# Patient Record
Sex: Female | Born: 1970 | Race: Asian | Hispanic: No | Marital: Married | Smoking: Current some day smoker
Health system: Southern US, Community
[De-identification: ages and names within clinical notes are randomized; demographics above are authoritative.]

---

## 2012-01-28 ENCOUNTER — Other Ambulatory Visit: Payer: Self-pay | Admitting: Internal Medicine

## 2012-01-28 DIAGNOSIS — Z1231 Encounter for screening mammogram for malignant neoplasm of breast: Secondary | ICD-10-CM

## 2012-02-03 ENCOUNTER — Ambulatory Visit
Admission: RE | Admit: 2012-02-03 | Discharge: 2012-02-03 | Disposition: A | Discharge status: Home / Self Care | Payer: Medicaid Other | Source: Ambulatory Visit | Attending: Internal Medicine | Admitting: Internal Medicine

## 2012-02-03 DIAGNOSIS — Z1231 Encounter for screening mammogram for malignant neoplasm of breast: Secondary | ICD-10-CM

## 2013-03-19 ENCOUNTER — Other Ambulatory Visit: Payer: Self-pay | Admitting: Internal Medicine

## 2013-03-19 DIAGNOSIS — Z1231 Encounter for screening mammogram for malignant neoplasm of breast: Secondary | ICD-10-CM

## 2013-04-16 ENCOUNTER — Ambulatory Visit
Admission: RE | Admit: 2013-04-16 | Discharge: 2013-04-16 | Disposition: A | Discharge status: Home / Self Care | Payer: Medicaid Other | Source: Ambulatory Visit | Attending: Internal Medicine | Admitting: Internal Medicine

## 2013-04-16 DIAGNOSIS — Z1231 Encounter for screening mammogram for malignant neoplasm of breast: Secondary | ICD-10-CM

## 2014-04-03 ENCOUNTER — Other Ambulatory Visit: Payer: Self-pay

## 2014-04-03 DIAGNOSIS — Z1231 Encounter for screening mammogram for malignant neoplasm of breast: Secondary | ICD-10-CM

## 2014-04-18 ENCOUNTER — Ambulatory Visit
Admission: RE | Admit: 2014-04-18 | Discharge: 2014-04-18 | Disposition: A | Discharge status: Home / Self Care | Payer: Medicaid Other | Source: Ambulatory Visit

## 2014-04-18 ENCOUNTER — Encounter (INDEPENDENT_AMBULATORY_CARE_PROVIDER_SITE_OTHER): Payer: Self-pay

## 2014-04-18 DIAGNOSIS — Z1231 Encounter for screening mammogram for malignant neoplasm of breast: Secondary | ICD-10-CM

## 2015-03-25 ENCOUNTER — Other Ambulatory Visit: Payer: Self-pay

## 2015-03-25 DIAGNOSIS — Z1231 Encounter for screening mammogram for malignant neoplasm of breast: Secondary | ICD-10-CM

## 2015-04-21 ENCOUNTER — Ambulatory Visit
Admission: RE | Admit: 2015-04-21 | Discharge: 2015-04-21 | Disposition: A | Discharge status: Home / Self Care | Payer: Medicaid Other | Source: Ambulatory Visit

## 2015-04-21 DIAGNOSIS — Z1231 Encounter for screening mammogram for malignant neoplasm of breast: Secondary | ICD-10-CM

## 2016-05-03 ENCOUNTER — Other Ambulatory Visit: Payer: Self-pay | Admitting: Internal Medicine

## 2016-05-03 DIAGNOSIS — Z1231 Encounter for screening mammogram for malignant neoplasm of breast: Secondary | ICD-10-CM

## 2016-05-06 ENCOUNTER — Ambulatory Visit
Admission: RE | Admit: 2016-05-06 | Discharge: 2016-05-06 | Disposition: A | Discharge status: Home / Self Care | Payer: Medicaid Other | Source: Ambulatory Visit | Attending: Internal Medicine | Admitting: Internal Medicine

## 2016-05-06 DIAGNOSIS — Z1231 Encounter for screening mammogram for malignant neoplasm of breast: Secondary | ICD-10-CM

## 2017-05-06 ENCOUNTER — Other Ambulatory Visit: Payer: Self-pay | Admitting: Internal Medicine

## 2017-05-06 DIAGNOSIS — Z1231 Encounter for screening mammogram for malignant neoplasm of breast: Secondary | ICD-10-CM

## 2017-05-20 ENCOUNTER — Ambulatory Visit
Admission: RE | Admit: 2017-05-20 | Discharge: 2017-05-20 | Disposition: A | Discharge status: Home / Self Care | Payer: Medicaid Other | Source: Ambulatory Visit | Attending: Internal Medicine | Admitting: Internal Medicine

## 2017-05-20 DIAGNOSIS — Z1231 Encounter for screening mammogram for malignant neoplasm of breast: Secondary | ICD-10-CM

## 2018-05-02 ENCOUNTER — Other Ambulatory Visit: Payer: Self-pay | Admitting: Internal Medicine

## 2018-05-02 DIAGNOSIS — Z1231 Encounter for screening mammogram for malignant neoplasm of breast: Secondary | ICD-10-CM

## 2018-05-26 ENCOUNTER — Encounter: Payer: Self-pay | Admitting: Radiology

## 2018-05-26 ENCOUNTER — Ambulatory Visit
Admission: RE | Admit: 2018-05-26 | Discharge: 2018-05-26 | Disposition: A | Discharge status: Home / Self Care | Payer: Medicaid Other | Source: Ambulatory Visit | Attending: Internal Medicine | Admitting: Internal Medicine

## 2018-05-26 DIAGNOSIS — Z1231 Encounter for screening mammogram for malignant neoplasm of breast: Secondary | ICD-10-CM

## 2019-12-19 ENCOUNTER — Encounter: Payer: Self-pay | Admitting: Internal Medicine

## 2019-12-19 ENCOUNTER — Ambulatory Visit (INDEPENDENT_AMBULATORY_CARE_PROVIDER_SITE_OTHER): Payer: 59 | Admitting: Internal Medicine

## 2019-12-19 VITALS — BP 169/70 | HR 75 | Temp 98.9°F | Ht 59.0 in | Wt 137.2 lb

## 2019-12-19 DIAGNOSIS — Z1239 Encounter for other screening for malignant neoplasm of breast: Secondary | ICD-10-CM

## 2019-12-19 DIAGNOSIS — Z131 Encounter for screening for diabetes mellitus: Secondary | ICD-10-CM | POA: Diagnosis not present

## 2019-12-19 DIAGNOSIS — R030 Elevated blood-pressure reading, without diagnosis of hypertension: Secondary | ICD-10-CM | POA: Insufficient documentation

## 2019-12-19 DIAGNOSIS — L609 Nail disorder, unspecified: Secondary | ICD-10-CM

## 2019-12-19 DIAGNOSIS — Z13220 Encounter for screening for lipoid disorders: Secondary | ICD-10-CM

## 2019-12-19 DIAGNOSIS — Z720 Tobacco use: Secondary | ICD-10-CM

## 2019-12-19 DIAGNOSIS — Z114 Encounter for screening for human immunodeficiency virus [HIV]: Secondary | ICD-10-CM | POA: Diagnosis not present

## 2019-12-19 DIAGNOSIS — Z1231 Encounter for screening mammogram for malignant neoplasm of breast: Secondary | ICD-10-CM

## 2019-12-19 DIAGNOSIS — Z0000 Encounter for general adult medical examination without abnormal findings: Secondary | ICD-10-CM

## 2019-12-19 DIAGNOSIS — F17200 Nicotine dependence, unspecified, uncomplicated: Secondary | ICD-10-CM

## 2019-12-19 LAB — POCT GLYCOSYLATED HEMOGLOBIN (HGB A1C): Hemoglobin A1C: 6 % — AB (ref 4.0–5.6)

## 2019-12-19 LAB — GLUCOSE, CAPILLARY: Glucose-Capillary: 104 mg/dL — ABNORMAL HIGH (ref 70–99)

## 2019-12-19 NOTE — Assessment & Plan Note (Signed)
Patient reports smoking approximately 1 cigarette/week

## 2019-12-19 NOTE — Patient Instructions (Signed)
You were seen to establish care. I do not think the change in your toenail is concerning. We checked blood work today to check your cholesterol, blood sugar, and HIV. These are all routine tests. I will call you with the results.  Thank you for allowing [place] to be part of your medical care!

## 2019-12-19 NOTE — Progress Notes (Signed)
   CC: Change in toenail  HPI: Patient is a 49 year old female with no significant past medical history who presents to establish care.  Medical history: Denies all medical history including diabetes and hypertension  Surgical History: Denies past surgeries  Review of Systems:   Review of Systems  Constitutional: Negative for chills and fever.  Respiratory: Negative for shortness of breath.   Cardiovascular: Negative for chest pain.  All other systems reviewed and are negative.  Family History: Denies family history of medical disease, specifically denies family history of diabetes and hypertension  Social: Social history significant for smoking 1 cigarette/week  Allergies: Denies allergies to medications  Physical Exam:  Vitals:   12/19/19 1423 12/19/19 1503  BP: (!) 151/107 (!) 169/70  Pulse: 82 75  Temp: 98.9 F (37.2 C)   TempSrc: Oral   SpO2: 99%   Weight: 137 lb 3.2 oz (62.2 kg)   Height: 4\' 11"  (1.499 m)    Physical Exam  Constitutional: She is well-developed, well-nourished, and in no distress.  HENT:  Head: Normocephalic and atraumatic.  Eyes: EOM are normal. Right eye exhibits no discharge. Left eye exhibits no discharge.  Neck: No tracheal deviation present.  Cardiovascular: Normal rate and regular rhythm. Exam reveals no gallop and no friction rub.  No murmur heard. Loud S2  Pulmonary/Chest: Effort normal and breath sounds normal. No respiratory distress. She has no wheezes. She has no rales.  Abdominal: Soft. She exhibits no distension. There is no abdominal tenderness. There is no rebound and no guarding.  Musculoskeletal:        General: No tenderness, deformity or edema. Normal range of motion.     Cervical back: Normal range of motion.     Comments: Right large toenail as pictured below  Neurological: She is alert. Coordination normal.  Skin: Skin is warm and dry. No rash noted. She is not diaphoretic. No erythema.  Psychiatric: Memory and judgment  normal.       Assessment & Plan:   See Encounters Tab for problem based charting.  Patient discussed with Dr. 

## 2019-12-19 NOTE — Assessment & Plan Note (Addendum)
Patient is a 49 year old female with a BMI of approximately 28. We will screen patient for HIV, acquire lipid panel, and hemoglobin A1c.  Last mammogram was 2 years ago, referral for repeat mammogram sent.

## 2019-12-19 NOTE — Assessment & Plan Note (Addendum)
Patient with blood pressure of 151/107, 169/70 on recheck. Elevation is asymptomatic. Patient counseled on weight loss, dietary changes.  Given that these are isolated readings, will have patient follow-up for repeat blood pressure check in 4 weeks and consideration of antihypertensive therapy.

## 2019-12-20 LAB — LIPID PANEL
Chol/HDL Ratio: 6.1 ratio — ABNORMAL HIGH (ref 0.0–4.4)
Cholesterol, Total: 188 mg/dL (ref 100–199)
HDL: 31 mg/dL — ABNORMAL LOW (ref >39)
LDL Chol Calc (NIH): 82 mg/dL (ref 0–99)
Triglycerides: 461 mg/dL — ABNORMAL HIGH (ref 0–149)
VLDL Cholesterol Cal: 75 mg/dL — ABNORMAL HIGH (ref 5–40)

## 2019-12-20 LAB — HIV ANTIBODY (ROUTINE TESTING W REFLEX): HIV Screen 4th Generation wRfx: NONREACTIVE

## 2019-12-26 NOTE — Progress Notes (Signed)
Internal Medicine Clinic Attending  Case discussed with Dr. MacLean at the time of the visit.  We reviewed the resident's history and exam and pertinent patient test results.  I agree with the assessment, diagnosis, and plan of care documented in the resident's note.    

## 2020-01-15 ENCOUNTER — Other Ambulatory Visit: Payer: Self-pay

## 2020-01-15 ENCOUNTER — Ambulatory Visit
Admission: RE | Admit: 2020-01-15 | Discharge: 2020-01-15 | Disposition: A | Discharge status: Home / Self Care | Payer: 59 | Source: Ambulatory Visit | Attending: Internal Medicine | Admitting: Internal Medicine

## 2020-01-15 DIAGNOSIS — Z1239 Encounter for other screening for malignant neoplasm of breast: Secondary | ICD-10-CM

## 2020-06-27 ENCOUNTER — Other Ambulatory Visit: Payer: Self-pay

## 2020-06-27 ENCOUNTER — Other Ambulatory Visit (HOSPITAL_COMMUNITY)
Admission: RE | Admit: 2020-06-27 | Discharge: 2020-06-27 | Disposition: A | Discharge status: Home / Self Care | Payer: 59 | Source: Ambulatory Visit | Attending: Obstetrics & Gynecology | Admitting: Obstetrics & Gynecology

## 2020-06-27 ENCOUNTER — Ambulatory Visit (INDEPENDENT_AMBULATORY_CARE_PROVIDER_SITE_OTHER): Payer: 59 | Admitting: Obstetrics & Gynecology

## 2020-06-27 ENCOUNTER — Encounter: Payer: Self-pay | Admitting: Obstetrics & Gynecology

## 2020-06-27 VITALS — BP 182/85 | HR 66 | Wt 135.1 lb

## 2020-06-27 DIAGNOSIS — Z23 Encounter for immunization: Secondary | ICD-10-CM | POA: Diagnosis not present

## 2020-06-27 DIAGNOSIS — Z01419 Encounter for gynecological examination (general) (routine) without abnormal findings: Secondary | ICD-10-CM | POA: Insufficient documentation

## 2020-06-27 DIAGNOSIS — Z1211 Encounter for screening for malignant neoplasm of colon: Secondary | ICD-10-CM

## 2020-06-27 NOTE — Progress Notes (Signed)
Subjective:     Valerie Galloway is a 49 y.o. female here for a routine exam. G5P5. Pt does not know the ages of her children however her youngest in 4 years old. She had SVD x5 3 in [place] and 2 in [place].  She did breast feed each child from 3-5 years. She reports that her LMP was 2017.  She denies complaints. No leaking of urine.    Gynecologic History Patient's last menstrual period was 05/11/2017. Contraception: post menopausal status Last Pap: 2012 Results were: normal Last mammogram: 01/15/2020. Results were: normal  Obstetric History OB History  Gravida Para Term Preterm AB Living  5 5 5     5   SAB TAB Ectopic Multiple Live Births          3    # Outcome Date GA Lbr Len/2nd Weight Sex Delivery Anes PTL Lv  5 Term           4 Term           3 Term     F    LIV  2 Term     F    LIV  1 Term     F    LIV     The following portions of the patient's history were reviewed and updated as appropriate: allergies, current medications, past family history, past medical history, past social history, past surgical history and problem list.  Review of Systems Pertinent items are noted in HPI.    Objective:   BP (!) 182/85   Pulse 66   Wt 135 lb 1.3 oz (61.3 kg)   LMP 05/11/2017   BMI 27.28 kg/m  General Appearance:    Alert, cooperative, no distress, appears stated age  Head:    Normocephalic, without obvious abnormality, atraumatic  Eyes:    conjunctiva/corneas clear, EOM's intact, both eyes  Ears:    Normal external ear canals, both ears  Nose:   Nares normal, septum midline, mucosa normal, no drainage    or sinus tenderness  Throat:   Lips, mucosa, and tongue normal; teeth and gums normal  Neck:   Supple, symmetrical, trachea midline, no adenopathy;    thyroid:  no enlargement/tenderness/nodules  Back:     Symmetric, no curvature, ROM normal, no CVA tenderness  Lungs:     respirations unlabored  Chest Wall:    No tenderness or deformity   Heart:    Regular rate and rhythm   Breast Exam:    No tenderness, masses, or nipple abnormality  Abdomen:     Soft, non-tender, bowel sounds active all four quadrants,    no masses, no organomegaly  Genitalia:    Normal female without lesion, discharge or tenderness     Extremities:   Extremities normal, atraumatic, no cyanosis or edema  Pulses:   2+ and symmetric all extremities  Skin:   Skin color, texture, turgor normal, no rashes or lesions    Assessment:    Healthy female exam.   Colon cancer screen- pt is not familiar with this and has not had any screen.    Plan:   Valerie Galloway was seen today for gynecologic exam.  Diagnoses and all orders for this visit:  Encounter for well woman exam with routine gynecological exam -     Cytology - PAP  Screening for colon cancer -     Ambulatory referral to Gastroenterology  Other orders -     Flu Vaccine QUAD 36+ mos IM (Fluarix,  Quad PF)  f/u in 1 year or sooner prn   Valerie Galloway, M.D., [facility]

## 2020-06-27 NOTE — Progress Notes (Signed)
fluNo Pap on file Flu Vaccine Mammo: 01/16/2020 Colonoscopy: Never

## 2020-06-30 LAB — CYTOLOGY - PAP
Comment: NEGATIVE
Diagnosis: NEGATIVE
High risk HPV: NEGATIVE

## 2020-09-22 ENCOUNTER — Telehealth: Payer: Self-pay | Admitting: *Deleted

## 2020-09-22 NOTE — Telephone Encounter (Signed)
Patient did not call us back. Cancelled colon and PV, mailed no show letter.

## 2020-09-22 NOTE — Telephone Encounter (Signed)
Patient no show PV appointment for today. I called patient, no answer, left a message for the patient to call us back today before 5 pm or the PV and procedure will be cancelled  

## 2020-09-22 NOTE — Telephone Encounter (Signed)
Son in law answered the phone and he will speak with her and call us back today before 5 pm to get the nurse visit rescheduled. He is aware if he does not call back before 5 pm then colon and PV is cancelled.

## 2020-10-06 ENCOUNTER — Encounter: Payer: 59 | Admitting: Internal Medicine

## 2021-03-04 ENCOUNTER — Other Ambulatory Visit: Payer: Self-pay | Admitting: Internal Medicine

## 2021-03-04 DIAGNOSIS — Z1231 Encounter for screening mammogram for malignant neoplasm of breast: Secondary | ICD-10-CM

## 2021-05-01 ENCOUNTER — Other Ambulatory Visit: Payer: Self-pay

## 2021-05-01 ENCOUNTER — Ambulatory Visit
Admission: RE | Admit: 2021-05-01 | Discharge: 2021-05-01 | Disposition: A | Discharge status: Home / Self Care | Payer: Medicaid Other | Source: Ambulatory Visit | Attending: Internal Medicine | Admitting: Internal Medicine

## 2021-05-01 DIAGNOSIS — Z1231 Encounter for screening mammogram for malignant neoplasm of breast: Secondary | ICD-10-CM

## 2021-07-11 ENCOUNTER — Ambulatory Visit: Payer: Medicaid Other | Admitting: Family Medicine

## 2021-08-15 ENCOUNTER — Ambulatory Visit (INDEPENDENT_AMBULATORY_CARE_PROVIDER_SITE_OTHER): Payer: Self-pay | Admitting: Family Medicine

## 2021-08-15 DIAGNOSIS — Z0289 Encounter for other administrative examinations: Secondary | ICD-10-CM

## 2021-08-19 NOTE — Progress Notes (Signed)
  Patient Name: Valerie Galloway Date of Birth: 1971-07-29 Date of Visit: 08/19/21 PCP: Patient, No Pcp Per (Inactive)  Chief Complaint: form completion   Subjective: Valerie Galloway is a pleasant 50 y.o. presenting today for completion of N-648 form.   The patient speaks Nepali as their primary language.  An interpreter was used for the entire visit. Patient was seen with resident Dorothyann Gibbs.  The purpose of this visit was explained with to the patient and family members. The patient was interviewed alone.   Identification confirmed and documented on N-648.   I have reviewed the patient's medical, surgical, family, and social history as appropriate.   HEENT: Sclera anicteric. Appears well hydrated. Neck: Supple Cardiac: Regular rate and rhythm. Normal S1/S2. No murmurs, rubs, or gallops appreciated. Lungs: Clear bilaterally to ascultation.  Abdomen: non-distended Extremities: Warm, well perfused without edema.  Skin: Warm, dry Psych: Pleasant and appropriate   Diagnoses and all orders for this visit:  Encounter for health examination of refugee    Patient signed [phone] Interpreter signed [phone]    Burley Saver, MD  [facility] Medicine Teaching Service

## 2022-03-29 ENCOUNTER — Other Ambulatory Visit: Payer: Self-pay | Admitting: Internal Medicine

## 2022-03-29 DIAGNOSIS — Z1231 Encounter for screening mammogram for malignant neoplasm of breast: Secondary | ICD-10-CM

## 2022-05-05 ENCOUNTER — Ambulatory Visit
Admission: RE | Admit: 2022-05-05 | Discharge: 2022-05-05 | Disposition: A | Discharge status: Home / Self Care | Payer: Medicaid Other | Source: Ambulatory Visit | Attending: Internal Medicine | Admitting: Internal Medicine

## 2022-05-05 DIAGNOSIS — Z1231 Encounter for screening mammogram for malignant neoplasm of breast: Secondary | ICD-10-CM

## 2022-10-28 IMAGING — MG MM DIGITAL SCREENING BILAT W/ TOMO AND CAD
8 series · 9 of 24 positions shown · non-contrast
Comparison: Previous exam(s).

CLINICAL DATA: Screening.

EXAM:
DIGITAL SCREENING BILATERAL MAMMOGRAM WITH TOMOSYNTHESIS AND CAD
TECHNIQUE: Bilateral screening digital craniocaudal and mediolateral oblique
mammograms were obtained. Bilateral screening digital breast
tomosynthesis was performed. The images were evaluated with
computer-aided detection.

[R MLO synth-2D]
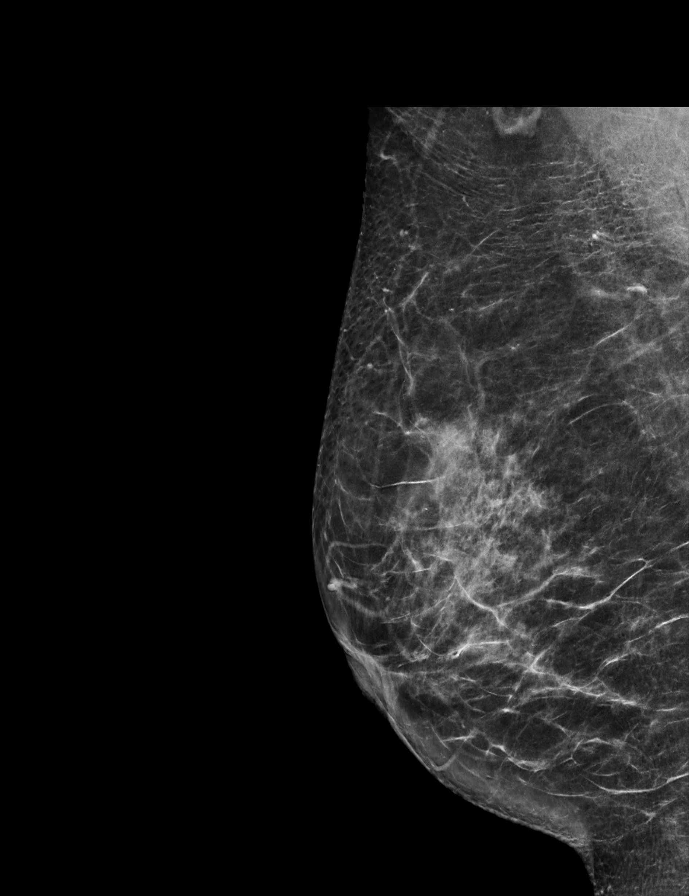

[R CC synth-2D]
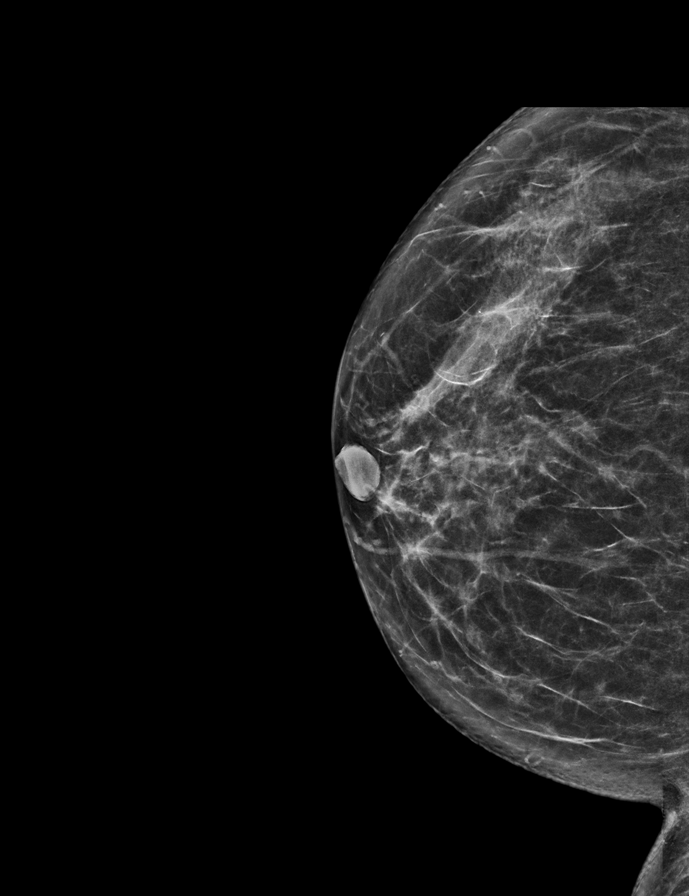

[L MLO synth-2D]
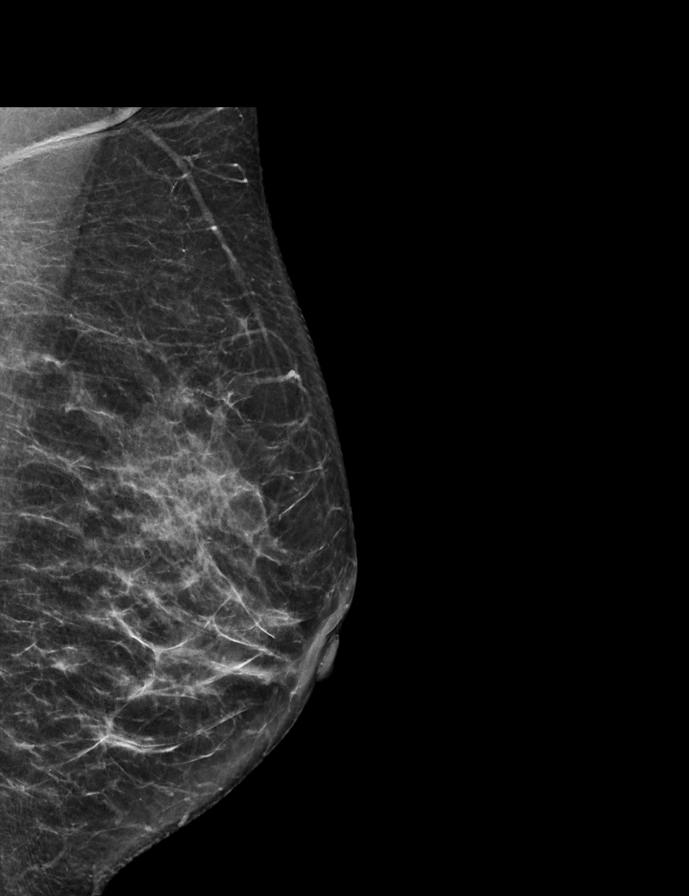

[L CC synth-2D]
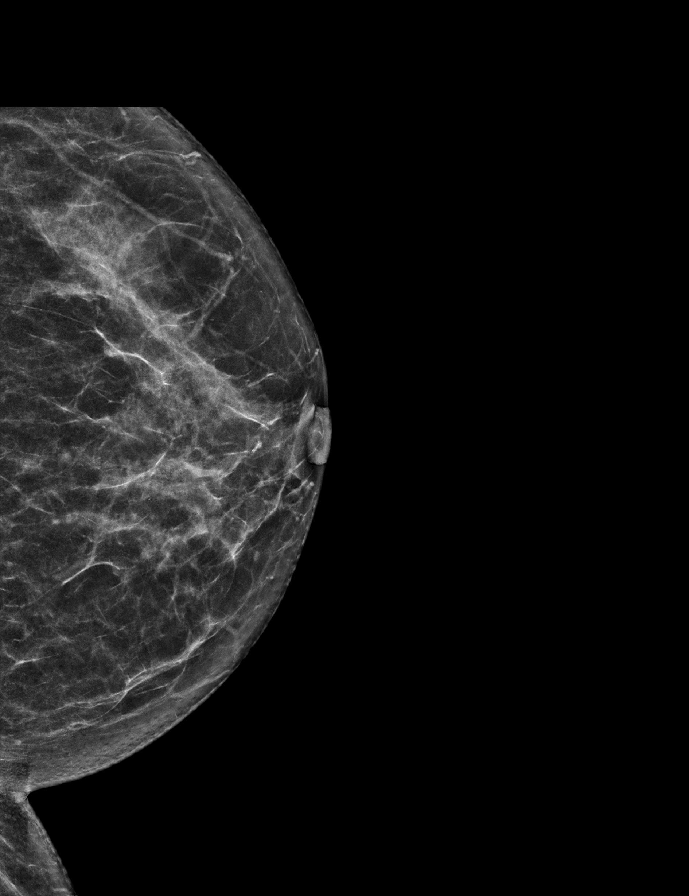

[L MLO tomo · 2 of 65 frames shown]
[frame 21/65]
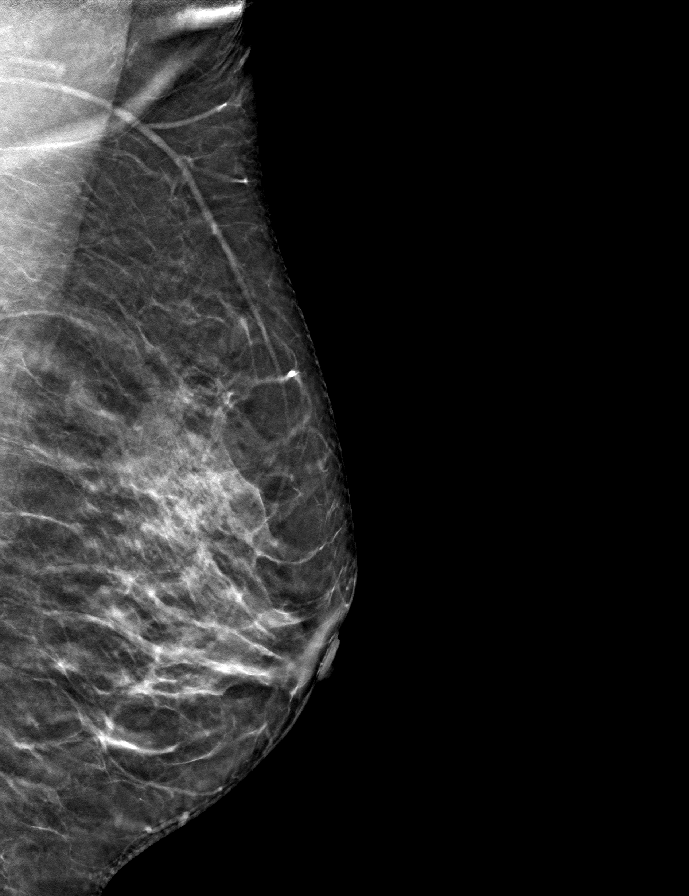
[frame 33/65]
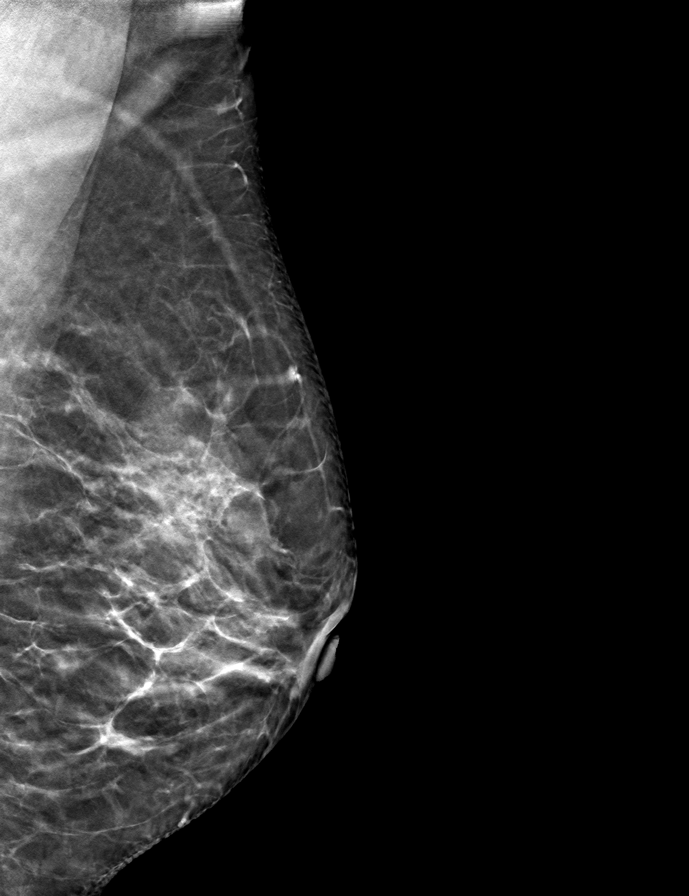

[R CC tomo · tomo slice 33/64.0]
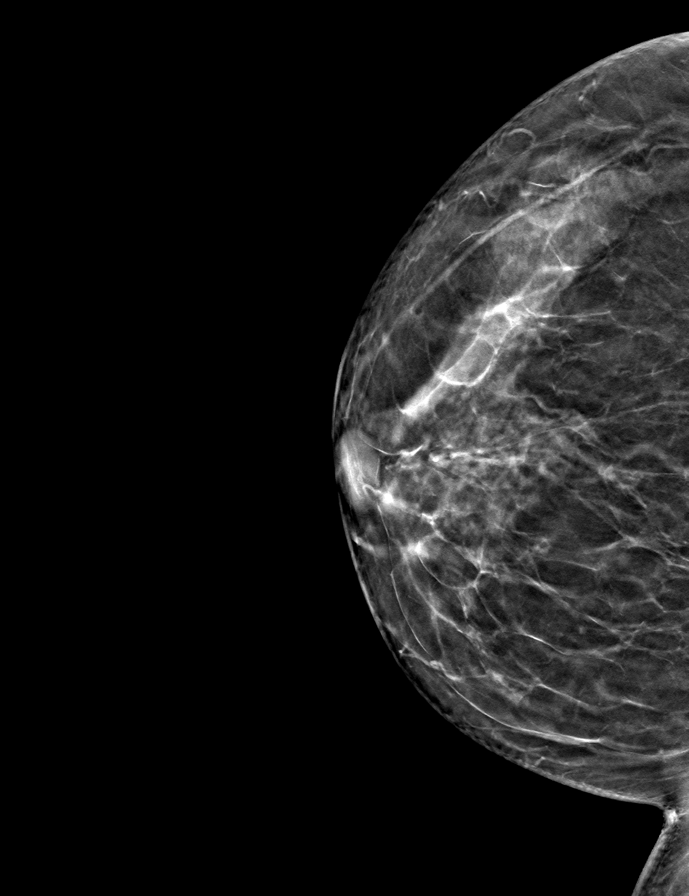

[L CC tomo · tomo slice 31/60.0]
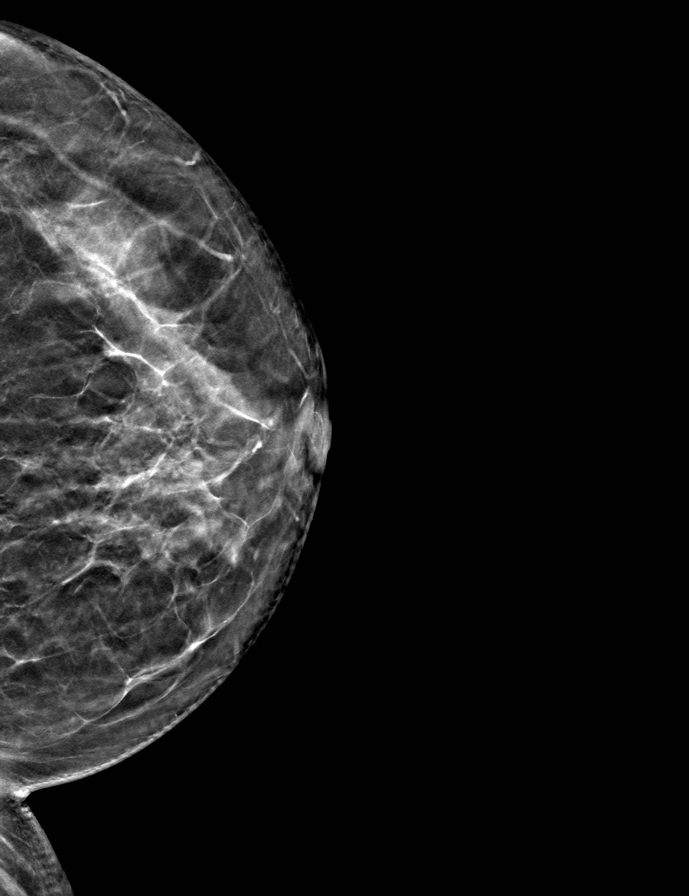

[R MLO tomo · tomo slice 37/72.0]
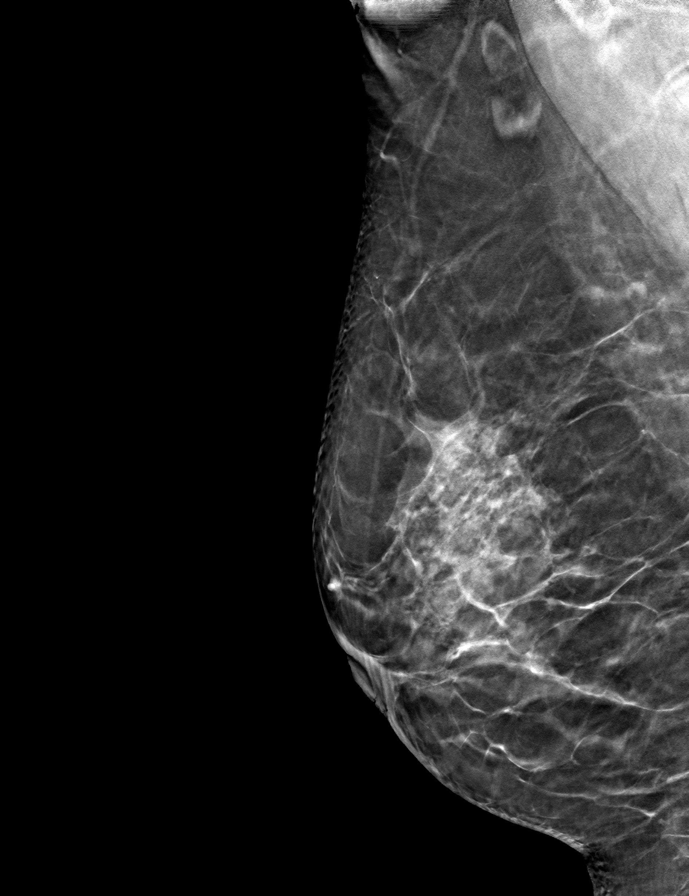

[9 of 24 positions shown; findings below may reference images not displayed]

ACR Breast Density Category c: The breast tissue is heterogeneously
dense, which may obscure small masses.
FINDINGS: There are no findings suspicious for malignancy.
IMPRESSION: No mammographic evidence of malignancy. A result letter of this
screening mammogram will be mailed directly to the patient.

RECOMMENDATION:
Screening mammogram in one year. (Code:Q3-W-BC3)

BI-RADS CATEGORY  1: Negative.

## 2023-05-04 ENCOUNTER — Other Ambulatory Visit: Payer: Self-pay | Admitting: Internal Medicine

## 2023-05-04 DIAGNOSIS — Z1231 Encounter for screening mammogram for malignant neoplasm of breast: Secondary | ICD-10-CM

## 2023-05-09 ENCOUNTER — Ambulatory Visit
Admission: RE | Admit: 2023-05-09 | Discharge: 2023-05-09 | Disposition: A | Discharge status: Home / Self Care | Payer: Medicaid Other | Source: Ambulatory Visit | Attending: Internal Medicine | Admitting: Internal Medicine

## 2023-05-09 DIAGNOSIS — Z1231 Encounter for screening mammogram for malignant neoplasm of breast: Secondary | ICD-10-CM

## 2023-12-29 ENCOUNTER — Encounter: Payer: Self-pay | Admitting: Internal Medicine

## 2024-06-08 ENCOUNTER — Other Ambulatory Visit: Payer: Self-pay | Admitting: Internal Medicine

## 2024-06-08 DIAGNOSIS — Z1231 Encounter for screening mammogram for malignant neoplasm of breast: Secondary | ICD-10-CM

## 2024-06-22 ENCOUNTER — Ambulatory Visit
Admission: RE | Admit: 2024-06-22 | Discharge: 2024-06-22 | Disposition: A | Discharge status: Home / Self Care | Source: Ambulatory Visit | Attending: Internal Medicine | Admitting: Internal Medicine

## 2024-06-22 DIAGNOSIS — Z1231 Encounter for screening mammogram for malignant neoplasm of breast: Secondary | ICD-10-CM
